# Patient Record
Sex: Female | Born: 1970 | Hispanic: Yes | Marital: Single | Smoking: Never smoker
Health system: Southern US, Community
[De-identification: ages and names within clinical notes are randomized; demographics above are authoritative.]

## PROBLEM LIST (undated history)

## (undated) DIAGNOSIS — F419 Anxiety disorder, unspecified: Secondary | ICD-10-CM

## (undated) HISTORY — PX: OTHER SURGICAL HISTORY: SHX169

## (undated) HISTORY — DX: Anxiety disorder, unspecified: F41.9

---

## 2019-08-30 ENCOUNTER — Ambulatory Visit (INDEPENDENT_AMBULATORY_CARE_PROVIDER_SITE_OTHER): Payer: 59 | Admitting: Emergency Medicine

## 2019-08-30 ENCOUNTER — Encounter: Payer: Self-pay | Admitting: Emergency Medicine

## 2019-08-30 ENCOUNTER — Other Ambulatory Visit: Payer: Self-pay

## 2019-08-30 ENCOUNTER — Ambulatory Visit (INDEPENDENT_AMBULATORY_CARE_PROVIDER_SITE_OTHER): Payer: 59

## 2019-08-30 VITALS — BP 110/74 | HR 75 | Temp 97.7°F | Resp 16 | Ht 65.0 in | Wt 164.0 lb

## 2019-08-30 DIAGNOSIS — R079 Chest pain, unspecified: Secondary | ICD-10-CM

## 2019-08-30 DIAGNOSIS — Z7689 Persons encountering health services in other specified circumstances: Secondary | ICD-10-CM

## 2019-08-30 NOTE — Patient Instructions (Addendum)
If you have lab work done today you will be contacted with your lab results within the next 2 weeks.  If you have not heard from [place] then please contact us. The fastest way to get your results is to register for My Chart.   IF you received an x-ray today, you will receive an invoice from [facility]. Please contact [facility] Radiology at [phone] with questions or concerns regarding your invoice.   IF you received labwork today, you will receive an invoice from [facility]. Please contact [facility] at [phone] with questions or concerns regarding your invoice.   Our billing staff will not be able to assist you with questions regarding bills from these companies.  You will be contacted with the lab results as soon as they are available. The fastest way to get your results is to activate your My Chart account. Instructions are located on the last page of this paperwork. If you have not heard from [place] regarding the results in 2 weeks, please contact this office.     Dolor de pecho inespecfico Nonspecific Chest Pain El dolor de pecho puede deberse a muchas afecciones diferentes. Algunas causas del dolor de pecho pueden ser potencialmente mortales. Estas requieren tratamiento inmediato. Algunas causas grave de dolor en el pecho son las siguientes:  Infarto de miocardio.  Un desgarro en el vaso sanguneo principal del cuerpo.  Enrojecimiento e hinchazn (inflamacin) alrededor del corazn.  Un cogulo de [employer]. Otras causas de dolor en el pecho pueden no ser tan graves. Estos incluyen los siguientes:  Merchant navy officer.  Ansiedad o estrs.  Dao en [place].  Infecciones pulmonares. El dolor de pecho puede provocar las siguientes sensaciones:  Dolor o molestias en el pecho.  Dolor opresivo, continuo o constrictivo.  Ardor u hormigueo.  Dolor sordo o intenso que empeora al [employer], toser o inhalar profundamente.  Dolor  o [employer] tambin se sienten en la espalda, el cuello, la [place], el hombro o el brazo, o dolor que se extiende a cualquiera de estas zonas. Es difcil saber si la causa del dolor es algo grave o algo que no es tan grave. Por lo tanto, es importante que consulte al mdico inmediatamente si tiene Tourist information centre manager. Siga estas indicaciones en su casa: Medicamentos  [employer] de venta libre y los recetados solamente como se lo haya indicado el mdico.  Si le recetaron un antibitico, tmelo como se lo haya indicado el mdico. No deje de tomar los antibiticos aunque comience a Sports administrator. Estilo de vida   Haga reposo como se lo haya indicado el mdico.  No consuma ningn producto que contenga nicotina o tabaco, como cigarrillos, cigarrillos electrnicos y tabaco de Higher education careers adviser. Si necesita ayuda para dejar de fumar, consulte al mdico.  No beba alcohol.  Haga cambios en su estilo de vida segn las indicaciones del mdico. Estos pueden incluir lo siguiente: ? Practicar actividad fsica con regularidad. Pregntele al mdico qu actividades son seguras para usted. ? Seguir una dieta cardiosaludable. Un especialista en dietas y alimentacin (nutricionista) puede ayudarlo a que haga elecciones saludables. ? Mantener un peso saludable. ? Tratar la diabetes o la presin arterial alta, si es necesario. ? Reducir el estrs. Las [employer] yoga y las tcnicas de relajacin pueden ayudar. Indicaciones generales  Est atento a cualquier cambio en los sntomas. Informe a su mdico si presenta algn cambio en los sntomas o si aparecen sntomas  nuevos.  Evite las [employer] causen dolor de [place].  Concurra a todas las visitas de seguimiento como se lo haya indicado el mdico. Esto es importante. Es posible que tenga que someterse a ms estudios si el dolor de pecho no desaparece. Comunquese con un mdico si:  El dolor de pecho no desaparece.  Se siente  deprimido.  Tiene fiebre. Solicite ayuda inmediatamente si:  El dolor en el pecho es ms intenso.  Tiene tos que empeora o tose con [place].  Tiene dolor muy intenso en el vientre (abdomen).  Pierde el conocimiento (se desmaya).  Tiene cualquiera de estos sntomas sin ninguna causa clara: ? Tree surgeon repentino en el pecho. ? Molestias repentinas en los brazos, la espalda, el cuello o la [place].  Le falta el aire en cualquier momento.  Comienza a sudar de [place] repentina o la piel se le humedece.  Siente malestar estomacal (nuseas).  Vomita.  Se siente repentinamente mareado o se desmaya.  Se siente muy dbil o cansado.  El corazn comienza a latirle rpidamente o parece que se saltea latidos. Estos sntomas pueden Sales executive. No espere a ver si los sntomas desaparecen. Solicite atencin mdica de inmediato. Comunquese con el servicio de emergencias de su localidad (911 en los [place]). No conduzca por sus propios medios Principal Financial. Resumen  El dolor de pecho puede deberse a muchas afecciones diferentes. La causa puede ser grave y requerir tratamiento de inmediato. Si tiene dolor de pecho, consulte al mdico de inmediato.  Siga las indicaciones del mdico para tomar los medicamentos y Field seismologist cambios en su estilo de vida.  Concurra a todas las visitas de seguimiento como se lo haya indicado el mdico. Esto incluye las visitas para realizarle otros estudios si el dolor de pecho no desaparece.  Asegrese de [employer] signos que indican que su afeccin ha empeorado. Obtenga ayuda de inmediato si tiene esos sntomas. Esta informacin no tiene Marine scientist el consejo del mdico. Asegrese de hacerle al mdico cualquier pregunta que tenga. Document Revised: 10/06/2017 Document Reviewed: 10/06/2017 Elsevier Patient Education  [place].

## 2019-08-30 NOTE — Progress Notes (Signed)
Meredith Singh 49 y.o.   Chief Complaint  Patient presents with  . Fall    per pt 2 months ago and when she takes a deep breath it causes pain    HISTORY OF PRESENT ILLNESS: This is a 49 y.o. female first visit to this office.  States she fell 2 months ago and landed on her chest, complaining of chest pain since.  Sharp on most of the pain to midsternal and left areas without radiation or associated symptoms.  Worse with movement and deep breathing. Non-smoker.  No history of chronic medical problems. No other complaints or medical concerns today. Fully vaccinated against Covid.  HPI   Prior to Admission medications   Medication Sig Start Date End Date Taking? Authorizing Provider  IBUPROFEN PO Take by mouth as needed.   Yes [provider]    No Known Allergies  There are no problems to display for this patient.   History reviewed. No pertinent past medical history.  History reviewed. No pertinent surgical history.  Social History   Socioeconomic History  . Marital status: Single    Spouse name: Not on file  . Number of children: Not on file  . Years of education: Not on file  . Highest education level: Not on file  Occupational History  . Not on file  Tobacco Use  . Smoking status: Never Smoker  . Smokeless tobacco: Never Used  Substance and Sexual Activity  . Alcohol use: Never  . Drug use: Never  . Sexual activity: Not on file  Other Topics Concern  . Not on file  Social History Narrative  . Not on file   Social Determinants of Health   Financial Resource Strain:   . Difficulty of Paying Living Expenses:   Food Insecurity:   . Worried About Charity fundraiser in the Last Year:   . Arboriculturist in the Last Year:   Transportation Needs:   . Film/video editor (Medical):   Marland Kitchen Lack of Transportation (Non-Medical):   Physical Activity:   . Days of Exercise per Week:   . Minutes of Exercise per Session:   Stress:   . Feeling of Stress :     Social Connections:   . Frequency of Communication with Friends and Family:   . Frequency of Social Gatherings with Friends and Family:   . Attends Religious Services:   . Active Member of Clubs or Organizations:   . Attends Archivist Meetings:   Marland Kitchen Marital Status:   Intimate Partner Violence:   . Fear of Current or Ex-Partner:   . Emotionally Abused:   Marland Kitchen Physically Abused:   . Sexually Abused:     History reviewed. No pertinent family history.   Review of Systems  Constitutional: Negative.  Negative for chills and fever.  HENT: Negative.  Negative for congestion and sore throat.   Respiratory: Negative.  Negative for cough and shortness of breath.   Cardiovascular: Positive for chest pain. Negative for palpitations, orthopnea, claudication and leg swelling.  Gastrointestinal: Negative.  Negative for abdominal pain, blood in stool, nausea and vomiting.  Genitourinary: Negative.  Negative for dysuria and hematuria.  Musculoskeletal: Negative.  Negative for back pain, myalgias and neck pain.  Skin: Negative.  Negative for rash.  Neurological: Negative.  Negative for dizziness and headaches.  All other systems reviewed and are negative.  Today's Vitals   08/30/19 1351  BP: 110/74  Pulse: 75  Resp: 16  Temp: 97.7  F (36.5 C)  TempSrc: Temporal  SpO2: 96%  Weight: 164 lb (74.4 kg)  Height: 5\' 5"  (1.651 m)   Body mass index is 27.29 kg/m.   Physical Exam Vitals reviewed.  Constitutional:      Appearance: Normal appearance.  HENT:     Head: Normocephalic.  Eyes:     Extraocular Movements: Extraocular movements intact.     Conjunctiva/sclera: Conjunctivae normal.     Pupils: Pupils are equal, round, and reactive to light.  Cardiovascular:     Rate and Rhythm: Normal rate and regular rhythm.     Pulses: Normal pulses.     Heart sounds: Normal heart sounds.  Pulmonary:     Effort: Pulmonary effort is normal.     Breath sounds: Normal breath sounds.   Chest:     Chest wall: No tenderness.  Musculoskeletal:        General: Normal range of motion.     Cervical back: Normal range of motion and neck supple.  Skin:    General: Skin is warm and dry.     Capillary Refill: Capillary refill takes less than 2 seconds.  Neurological:     General: No focal deficit present.     Mental Status: She is alert and oriented to person, place, and time.  Psychiatric:        Mood and Affect: Mood normal.        Behavior: Behavior normal.    EKG: Normal sinus rhythm with ventricular rate of 62/min.  No acute ischemic changes.  Normal EKG.   DG Chest 2 View  Result Date: 08/30/2019 CLINICAL DATA:  Chest pain today. EXAM: CHEST - 2 VIEW COMPARISON:  None. FINDINGS: Lungs clear. Heart size normal. No pneumothorax or pleural fluid. No acute or focal bony abnormality. IMPRESSION: Negative chest. Electronically Signed   By: Inge Rise M.D.   On: 08/30/2019 14:26   A total of 45 minutes was spent with the patient, greater than 50% of which was in counseling/coordination of care regarding differential diagnosis of chest pain, review of today's EKG, review of today's chest x-ray, review of past medical history, ED precautions, prognosis and need for follow-up.  ASSESSMENT & PLAN: Emree was seen today for fall.  Diagnoses and all orders for this visit:  Nonspecific chest pain -     EKG 12-Lead -     DG Chest 2 View  Encounter to establish care    Patient Instructions       If you have lab work done today you will be contacted with your lab results within the next 2 weeks.  If you have not heard from [place] then please contact us. The fastest way to get your results is to register for My Chart.   IF you received an x-ray today, you will receive an invoice from [facility]. Please contact [facility] Radiology at [phone] with questions or concerns regarding your invoice.   IF you received labwork today, you will receive an invoice  from [facility]. Please contact [facility] at [phone] with questions or concerns regarding your invoice.   Our billing staff will not be able to assist you with questions regarding bills from these companies.  You will be contacted with the lab results as soon as they are available. The fastest way to get your results is to activate your My Chart account. Instructions are located on the last page of this paperwork. If you have not heard from [place] regarding the results in 2 weeks,  please contact this office.     Dolor de pecho inespecfico Nonspecific Chest Pain El dolor de pecho puede deberse a muchas afecciones diferentes. Algunas causas del dolor de pecho pueden ser potencialmente mortales. Estas requieren tratamiento inmediato. Algunas causas grave de dolor en el pecho son las siguientes:  Infarto de miocardio.  Un desgarro en el vaso sanguneo principal del cuerpo.  Enrojecimiento e hinchazn (inflamacin) alrededor del corazn.  Un cogulo de [employer]. Otras causas de dolor en el pecho pueden no ser tan graves. Estos incluyen los siguientes:  Merchant navy officer.  Ansiedad o estrs.  Dao en [place].  Infecciones pulmonares. El dolor de pecho puede provocar las siguientes sensaciones:  Dolor o molestias en el pecho.  Dolor opresivo, continuo o constrictivo.  Ardor u hormigueo.  Dolor sordo o intenso que empeora al [employer], toser o inhalar profundamente.  Dolor o [employer] tambin se sienten en la espalda, el cuello, la [place], el hombro o el brazo, o dolor que se extiende a cualquiera de estas zonas. Es difcil saber si la causa del dolor es algo grave o algo que no es tan grave. Por lo tanto, es importante que consulte al mdico inmediatamente si tiene Tourist information centre manager. Siga estas indicaciones en su casa: Medicamentos  [employer] de venta libre y los recetados solamente como se lo haya indicado el mdico.  Si le  recetaron un antibitico, tmelo como se lo haya indicado el mdico. No deje de tomar los antibiticos aunque comience a Sports administrator. Estilo de vida   Haga reposo como se lo haya indicado el mdico.  No consuma ningn producto que contenga nicotina o tabaco, como cigarrillos, cigarrillos electrnicos y tabaco de Higher education careers adviser. Si necesita ayuda para dejar de fumar, consulte al mdico.  No beba alcohol.  Haga cambios en su estilo de vida segn las indicaciones del mdico. Estos pueden incluir lo siguiente: ? Practicar actividad fsica con regularidad. Pregntele al mdico qu actividades son seguras para usted. ? Seguir una dieta cardiosaludable. Un especialista en dietas y alimentacin (nutricionista) puede ayudarlo a que haga elecciones saludables. ? Mantener un peso saludable. ? Tratar la diabetes o la presin arterial alta, si es necesario. ? Reducir el estrs. Las [employer] yoga y las tcnicas de relajacin pueden ayudar. Indicaciones generales  Est atento a cualquier cambio en los sntomas. Informe a su mdico si presenta algn cambio en los sntomas o si aparecen sntomas nuevos.  Evite las [employer] causen dolor de [place].  Concurra a todas las visitas de seguimiento como se lo haya indicado el mdico. Esto es importante. Es posible que tenga que someterse a ms estudios si el dolor de pecho no desaparece. Comunquese con un mdico si:  El dolor de pecho no desaparece.  Se siente deprimido.  Tiene fiebre. Solicite ayuda inmediatamente si:  El dolor en el pecho es ms intenso.  Tiene tos que empeora o tose con [place].  Tiene dolor muy intenso en el vientre (abdomen).  Pierde el conocimiento (se desmaya).  Tiene cualquiera de estos sntomas sin ninguna causa clara: ? Tree surgeon repentino en el pecho. ? Molestias repentinas en los brazos, la espalda, el cuello o la [place].  Le falta el aire en cualquier momento.  Comienza a sudar de [place] repentina o  la piel se le humedece.  Siente malestar estomacal (nuseas).  Vomita.  Se siente repentinamente mareado o se desmaya.  Se siente muy dbil o cansado.  El corazn comienza a latirle rpidamente o parece que se saltea latidos. Estos sntomas pueden Sales executive. No espere a ver si los sntomas desaparecen. Solicite atencin mdica de inmediato. Comunquese con el servicio de emergencias de su localidad (911 en los [place]). No conduzca por sus propios medios [employer] Financial. Resumen  El dolor de pecho puede deberse a muchas afecciones diferentes. La causa puede ser grave y requerir tratamiento de inmediato. Si tiene dolor de pecho, consulte al mdico de inmediato.  Siga las indicaciones del mdico para tomar los medicamentos y Field seismologist cambios en su estilo de vida.  Concurra a todas las visitas de seguimiento como se lo haya indicado el mdico. Esto incluye las visitas para realizarle otros estudios si el dolor de pecho no desaparece.  Asegrese de [employer] signos que indican que su afeccin ha empeorado. Obtenga ayuda de inmediato si tiene esos sntomas. Esta informacin no tiene Marine scientist el consejo del mdico. Asegrese de hacerle al mdico cualquier pregunta que tenga. Document Revised: 10/06/2017 Document Reviewed: 10/06/2017 Elsevier Patient Education  2020 Elsevier Inc.      Agustina Caroli, MD [facility]

## 2019-11-08 ENCOUNTER — Encounter: Payer: Self-pay | Admitting: Emergency Medicine

## 2019-11-08 ENCOUNTER — Ambulatory Visit (INDEPENDENT_AMBULATORY_CARE_PROVIDER_SITE_OTHER): Payer: 59 | Admitting: Emergency Medicine

## 2019-11-08 ENCOUNTER — Other Ambulatory Visit: Payer: Self-pay

## 2019-11-08 VITALS — BP 126/70 | HR 82 | Temp 98.6°F | Ht 65.0 in | Wt 165.0 lb

## 2019-11-08 DIAGNOSIS — D259 Leiomyoma of uterus, unspecified: Secondary | ICD-10-CM | POA: Diagnosis not present

## 2019-11-08 DIAGNOSIS — R102 Pelvic and perineal pain: Secondary | ICD-10-CM | POA: Diagnosis not present

## 2019-11-08 DIAGNOSIS — G8929 Other chronic pain: Secondary | ICD-10-CM | POA: Diagnosis not present

## 2019-11-08 MED ORDER — MELOXICAM 7.5 MG PO TABS: 7.5000 mg | ORAL_TABLET | Freq: Every day | ORAL | 0 refills | Status: DC | PRN

## 2019-11-08 NOTE — Patient Instructions (Addendum)
     If you have lab work done today you will be contacted with your lab results within the next 2 weeks.  If you have not heard from [place] then please contact us. The fastest way to get your results is to register for My Chart.   IF you received an x-ray today, you will receive an invoice from [facility] Rad[facility]. Please contact [facility] Radiology at [phone] with questions or concerns regarding your invoice.   IF you received labwork today, you will receive an invoice from [facility]. Please contact [facility] at [phone] with questions or concerns regarding your invoice.   Our billing staff will not be able to assist you with questions regarding bills from these companies.  You will be contacted with the lab results as soon as they are available. The fastest way to get your results is to activate your My Chart account. Instructions are located on the last page of this paperwork. If you have not heard from [place] regarding the results in 2 weeks, please contact this office.      Dolor plvico en la mujer Pelvic Pain, Female El dolor plvico se siente en la parte inferior del vientre (abdomen), debajo del ombligo y a nivel de las caderas. El dolor puede comenzar en forma repentina (ser [employer]), reaparecer (ser recurrente) o durar mucho tiempo (volverse crnico). El dolor plvico que dura ms de 6 meses se denomina dolor plvico crnico. El dolor plvico puede tener muchas causas. A veces, la causa del dolor plvico no se conoce. Siga estas indicaciones en su casa:   Tome los medicamentos de venta libre y los recetados solamente como se lo haya indicado el mdico.  Haga reposo como se lo haya indicado el mdico.  No tenga relaciones sexuales si siente dolor.  Lleve un registro del dolor plvico. Escriba los siguientes datos: ? Cundo Energy manager. ? La ubicacin del dolor. ? Qu parece mejorar o empeorar el dolor, como alimentos o el perodo (ciclo menstrual). ? Cualquier sntoma  que se presente junto con Conservation officer, historic buildings.  Concurra a todas las visitas de control como se lo haya indicado el mdico. Esto es importante. Comunquese con un mdico si:  Los medicamentos no Forensic psychologist.  El dolor regresa.  Aparecen nuevos sntomas.  Tiene sangrado o secrecin inusual de la vagina.  Tiene fiebre o escalofros.  Tiene dificultad para defecar (estreimiento).  Observa sangre en el pis (orina) o en la materia fecal (heces).  El pis tiene mal olor.  Se siente dbil o siente que va a desvanecerse. Solicite ayuda inmediatamente si:  Technical brewer repentino y Wellsite geologist.  El dolor es cada Producer, television/film/video.  Siente un dolor muy intenso y tambin tiene alguno de estos sntomas: ? Cristy Hilts. ? Malestar estomacal (nuseas). ? Vmitos. ? Tiene mucho sudor.  Se desmaya (pierde el conocimiento). Resumen  El dolor plvico se siente en la parte inferior del vientre (abdomen), debajo del ombligo y a nivel de las caderas.  Hay muchas causas posibles del dolor plvico.  Lleve un registro del dolor plvico. Esta informacin no tiene Marine scientist el consejo del mdico. Asegrese de hacerle al mdico cualquier pregunta que tenga. Document Revised: 09/25/2017 Document Reviewed: 09/25/2017 Elsevier Patient Education  [place].

## 2019-11-08 NOTE — Progress Notes (Signed)
Meredith Singh 49 y.o.   Chief Complaint  Patient presents with  . Stress    recently relocation     HISTORY OF PRESENT ILLNESS: This is a 49 y.o. female recently moved to [facility] from [place], needs gynecology referral. Has history of chronic pelvic pain.  Pelvic ultrasound done 3 months ago showed uterine fibroids and bilateral ovarian cysts. Still has regular menstrual periods. Increased amount of emotional stress due to daughter's recent suicide attempt and relocation to this area. Has tried taking ibuprofen 800 mg for pelvic pain but no longer helping. Has no other complaints or medical concerns today.  HPI   Prior to Admission medications   Medication Sig Start Date End Date Taking? Authorizing Provider  IBUPROFEN PO Take by mouth as needed.   Yes [provider]    No Known Allergies  There are no problems to display for this patient.   Past Medical History:  Diagnosis Date  . Anxiety     History reviewed. No pertinent surgical history.  Social History   Socioeconomic History  . Marital status: Single    Spouse name: Not on file  . Number of children: Not on file  . Years of education: Not on file  . Highest education level: Not on file  Occupational History  . Not on file  Tobacco Use  . Smoking status: Never Smoker  . Smokeless tobacco: Never Used  Substance and Sexual Activity  . Alcohol use: Never  . Drug use: Never  . Sexual activity: Not on file  Other Topics Concern  . Not on file  Social History Narrative  . Not on file   Social Determinants of Health   Financial Resource Strain:   . Difficulty of Paying Living Expenses: Not on file  Food Insecurity:   . Worried About Charity fundraiser in the Last Year: Not on file  . Ran Out of Food in the Last Year: Not on file  Transportation Needs:   . Lack of Transportation (Medical): Not on file  . Lack of Transportation (Non-Medical): Not on file  Physical Activity:   . Days of  Exercise per Week: Not on file  . Minutes of Exercise per Session: Not on file  Stress:   . Feeling of Stress : Not on file  Social Connections:   . Frequency of Communication with Friends and Family: Not on file  . Frequency of Social Gatherings with Friends and Family: Not on file  . Attends Religious Services: Not on file  . Active Member of Clubs or Organizations: Not on file  . Attends Archivist Meetings: Not on file  . Marital Status: Not on file  Intimate Partner Violence:   . Fear of Current or Ex-Partner: Not on file  . Emotionally Abused: Not on file  . Physically Abused: Not on file  . Sexually Abused: Not on file    Family History  Problem Relation Age of Onset  . Hyperlipidemia Mother   . Diabetes Mother   . Heart disease Father      Review of Systems  Constitutional: Negative.  Negative for chills and fever.  HENT: Negative.  Negative for congestion and sore throat.   Respiratory: Negative.  Negative for cough and shortness of breath.   Cardiovascular: Negative.  Negative for chest pain and palpitations.  Gastrointestinal: Negative.  Negative for abdominal pain, diarrhea, nausea and vomiting.  Genitourinary: Negative.  Negative for dysuria and hematuria.  Musculoskeletal: Negative.  Negative for back  pain, myalgias and neck pain.  Skin: Negative.  Negative for rash.  Neurological: Negative for dizziness and headaches.  All other systems reviewed and are negative.  Today's Vitals   11/08/19 1436  BP: 126/70  Pulse: 82  Temp: 98.6 F (37 C)  TempSrc: Temporal  SpO2: 97%  Weight: 165 lb (74.8 kg)  Height: 5\' 5"  (1.651 m)   Body mass index is 27.46 kg/m.   Physical Exam Vitals reviewed.  Constitutional:      Appearance: Normal appearance.  HENT:     Head: Normocephalic.  Eyes:     Extraocular Movements: Extraocular movements intact.     Conjunctiva/sclera: Conjunctivae normal.     Pupils: Pupils are equal, round, and reactive to  light.  Cardiovascular:     Rate and Rhythm: Normal rate and regular rhythm.     Pulses: Normal pulses.     Heart sounds: Normal heart sounds.  Pulmonary:     Effort: Pulmonary effort is normal.     Breath sounds: Normal breath sounds.  Abdominal:     General: There is no distension.     Palpations: Abdomen is soft.     Tenderness: There is no abdominal tenderness.  Musculoskeletal:     Cervical back: Normal range of motion and neck supple.  Skin:    General: Skin is warm and dry.     Capillary Refill: Capillary refill takes less than 2 seconds.  Neurological:     General: No focal deficit present.     Mental Status: She is alert and oriented to person, place, and time.  Psychiatric:        Mood and Affect: Mood normal.        Behavior: Behavior normal.     A total of 30 minutes was spent with the patient, greater than 50% of which was in counseling/coordination of care regarding differential diagnosis of chronic pelvic pain, need for diagnostic work-up including gynecology evaluation, blood work, pain management, diet and nutrition, prognosis and need for follow-up.   ASSESSMENT & PLAN: Meredith Singh was seen today for stress.  Diagnoses and all orders for this visit:  Chronic pelvic pain in female -     Ambulatory referral to Gynecology -     CBC with Differential/Platelet -     Comprehensive metabolic panel  Uterine leiomyoma, unspecified location -     Ambulatory referral to Gynecology    Patient Instructions       If you have lab work done today you will be contacted with your lab results within the next 2 weeks.  If you have not heard from [place] then please contact us. The fastest way to get your results is to register for My Chart.   IF you received an x-ray today, you will receive an invoice from [facility]. Please contact [facility] at [phone] with questions or concerns regarding your invoice.   IF you received labwork today, you will  receive an invoice from [facility]. Please contact [facility] at [phone] with questions or concerns regarding your invoice.   Our billing staff will not be able to assist you with questions regarding bills from these companies.  You will be contacted with the lab results as soon as they are available. The fastest way to get your results is to activate your My Chart account. Instructions are located on the last page of this paperwork. If you have not heard from [place] regarding the results in 2 weeks, please contact this office.  Dolor plvico en la mujer Pelvic Pain, Female El dolor plvico se siente en la parte inferior del vientre (abdomen), debajo del ombligo y a nivel de las caderas. El dolor puede comenzar en forma repentina (ser [employer]), reaparecer (ser recurrente) o durar mucho tiempo (volverse crnico). El dolor plvico que dura ms de 6 meses se denomina dolor plvico crnico. El dolor plvico puede tener muchas causas. A veces, la causa del dolor plvico no se conoce. Siga estas indicaciones en su casa:   Tome los medicamentos de venta libre y los recetados solamente como se lo haya indicado el mdico.  Haga reposo como se lo haya indicado el mdico.  No tenga relaciones sexuales si siente dolor.  Lleve un registro del dolor plvico. Escriba los siguientes datos: ? Cundo Energy manager. ? La ubicacin del dolor. ? Qu parece mejorar o empeorar el dolor, como alimentos o el perodo (ciclo menstrual). ? Cualquier sntoma que se presente junto con Conservation officer, historic buildings.  Concurra a todas las visitas de control como se lo haya indicado el mdico. Esto es importante. Comunquese con un mdico si:  Los medicamentos no Forensic psychologist.  El dolor regresa.  Aparecen nuevos sntomas.  Tiene sangrado o secrecin inusual de la vagina.  Tiene fiebre o escalofros.  Tiene dificultad para defecar (estreimiento).  Observa sangre en el pis (orina) o en la materia fecal (heces).  El  pis tiene mal olor.  Se siente dbil o siente que va a desvanecerse. Solicite ayuda inmediatamente si:  Technical brewer repentino y Wellsite geologist.  El dolor es cada Producer, television/film/video.  Siente un dolor muy intenso y tambin tiene alguno de estos sntomas: ? Cristy Hilts. ? Malestar estomacal (nuseas). ? Vmitos. ? Tiene mucho sudor.  Se desmaya (pierde el conocimiento). Resumen  El dolor plvico se siente en la parte inferior del vientre (abdomen), debajo del ombligo y a nivel de las caderas.  Hay muchas causas posibles del dolor plvico.  Lleve un registro del dolor plvico. Esta informacin no tiene Marine scientist el consejo del mdico. Asegrese de hacerle al mdico cualquier pregunta que tenga. Document Revised: 09/25/2017 Document Reviewed: 09/25/2017 Elsevier Patient Education  2020 Elsevier Inc.      Agustina Caroli, MD [facility]

## 2019-11-09 LAB — CBC WITH DIFFERENTIAL/PLATELET
Basophils Absolute: 0 10*3/uL (ref 0.0–0.2)
Basos: 0 %
EOS (ABSOLUTE): 0.1 10*3/uL (ref 0.0–0.4)
Eos: 1 %
Hematocrit: 37.4 % (ref 34.0–46.6)
Hemoglobin: 12.1 g/dL (ref 11.1–15.9)
Immature Grans (Abs): 0 10*3/uL (ref 0.0–0.1)
Immature Granulocytes: 0 %
Lymphocytes Absolute: 2.1 10*3/uL (ref 0.7–3.1)
Lymphs: 26 %
MCH: 28.7 pg (ref 26.6–33.0)
MCHC: 32.4 g/dL (ref 31.5–35.7)
MCV: 89 fL (ref 79–97)
Monocytes Absolute: 0.6 10*3/uL (ref 0.1–0.9)
Monocytes: 7 %
Neutrophils Absolute: 5.3 10*3/uL (ref 1.4–7.0)
Neutrophils: 66 %
Platelets: 297 10*3/uL (ref 150–450)
RBC: 4.22 x10E6/uL (ref 3.77–5.28)
RDW: 13.3 % (ref 11.7–15.4)
WBC: 8.1 10*3/uL (ref 3.4–10.8)

## 2019-11-09 LAB — COMPREHENSIVE METABOLIC PANEL
ALT: 21 IU/L (ref 0–32)
AST: 15 IU/L (ref 0–40)
Albumin/Globulin Ratio: 1.4 (ref 1.2–2.2)
Albumin: 4.2 g/dL (ref 3.8–4.8)
Alkaline Phosphatase: 85 IU/L (ref 44–121)
BUN/Creatinine Ratio: 31 — ABNORMAL HIGH (ref 9–23)
BUN: 16 mg/dL (ref 6–24)
Bilirubin Total: <0.2 mg/dL (ref 0.0–1.2)
CO2: 22 mmol/L (ref 20–29)
Calcium: 9.2 mg/dL (ref 8.7–10.2)
Chloride: 105 mmol/L (ref 96–106)
Creatinine, Ser: 0.52 mg/dL — ABNORMAL LOW (ref 0.57–1.00)
GFR calc Af Amer: 131 mL/min/{1.73_m2} (ref >59)
GFR calc non Af Amer: 113 mL/min/{1.73_m2} (ref >59)
Globulin, Total: 3.1 g/dL (ref 1.5–4.5)
Glucose: 126 mg/dL — ABNORMAL HIGH (ref 65–99)
Potassium: 4.1 mmol/L (ref 3.5–5.2)
Sodium: 140 mmol/L (ref 134–144)
Total Protein: 7.3 g/dL (ref 6.0–8.5)

## 2019-11-26 ENCOUNTER — Ambulatory Visit: Payer: 59 | Admitting: Obstetrics and Gynecology

## 2019-11-26 ENCOUNTER — Encounter: Payer: Self-pay | Admitting: Obstetrics and Gynecology

## 2019-11-26 ENCOUNTER — Other Ambulatory Visit: Payer: Self-pay

## 2019-11-26 VITALS — BP 118/74 | Ht 65.0 in | Wt 163.0 lb

## 2019-11-26 DIAGNOSIS — R102 Pelvic and perineal pain: Secondary | ICD-10-CM

## 2019-11-26 DIAGNOSIS — B9689 Other specified bacterial agents as the cause of diseases classified elsewhere: Secondary | ICD-10-CM

## 2019-11-26 DIAGNOSIS — N852 Hypertrophy of uterus: Secondary | ICD-10-CM | POA: Diagnosis not present

## 2019-11-26 DIAGNOSIS — N898 Other specified noninflammatory disorders of vagina: Secondary | ICD-10-CM

## 2019-11-26 DIAGNOSIS — N760 Acute vaginitis: Secondary | ICD-10-CM | POA: Diagnosis not present

## 2019-11-26 LAB — WET PREP FOR TRICH, YEAST, CLUE

## 2019-11-26 MED ORDER — METRONIDAZOLE 500 MG PO TABS: 500.0000 mg | ORAL_TABLET | Freq: Two times a day (BID) | ORAL | 0 refills | Status: DC

## 2019-11-26 NOTE — Patient Instructions (Addendum)
Qu son los fibromas?--Los fibromas son ndulos anormales que se forman en el msculo del tero (figura 1). El tero, tambin llamado matriz, es la parte del cuerpo donde se aloja un beb en caso de [place]. A veces, se [place] la palabra "tumores" para referirse a los fibromas. Sin embargo, los fibromas no son un tipo de Hotel manager.  Cules son los sntomas de los fibromas?--En general, los fibromas no provocan ningn sntoma, pero cuando s los provocan, estos pueden ser algunos: ?Periodos intensos ?Dolor, presin o sensacin de "llenura" en el rea del estmago ?Necesidad de Garment/textile technologist con frecuencia ?Evacuacin con poca frecuencia (estreimiento) ?Dificultad para quedar embarazada Cmo se tratan los fibromas?--Existen varias opciones de [place]. Cada opcin tiene sus riesgos y sus beneficios. El tratamiento adecuado para usted depender de: ?Sus sntomas ?Su edad (la [employer] de los fibromas se encogen y dejan de causar sntomas despus de la menopausia, que es cuando deja de tener sus periodos [place]) ?Si quiere quedar embarazada ms adelante o no ?Si los fibromas la Cabin crew tanto que padece de anemia ?El [place], la cantidad y la ubicacin de los fibromas ?Cmo se siente con respecto a los riesgos y beneficios de cada opcin Si est considerando el tratamiento, pregunte a su mdico o enfermero qu tratamiento podra ayudarla. Luego, infrmese acerca de los riesgos y beneficios de cada opcin. Tambin pregunte qu sucede si no se somete a ningn tratamiento, y asegrese de Engineer, manufacturing si quiere o no quedar embarazada ms adelante. Estas son las opciones: ?Evans pldoras, los parches, los anillos vaginales, las inyecciones y los implantes utilizados como mtodo anticonceptivo pueden disminuir el sangrado durante el periodo. Algunos tipos de dispositivos intrauterinos, o DIU, tambin pueden disminuir la intensidad del periodo. Adems de los mtodos de planificacin familiar, hay  medicinas que pueden hacer que los sangrados sean menos intensos. Si el sangrado es su sntoma principal, el mdico podra recetarle alguna de esas medicinas. ?[place] para extirpar los fibromas - Se llama "miomectoma". Durante esta operacin, el mdico extirpa los fibromas pero deja el tero en su lugar. Es Armed forces logistics/support/administrative officer, pero no siempre es una solucin permanente, ya que los fibromas pueden volver a producirse. En general, la miomectoma es una buena opcin para quienes podran querer tener un embarazo ms adelante. ?Tratamiento para interrumpir el suministro de sangre a los fibromas - Se llama "embolizacin de arterias uterinas" o "embolizacin de fibroma uterino". En este procedimiento, el mdico inserta un tubo delgado en una arteria de una pierna y lo desplaza hasta el tero. Luego, utiliza pequeas cuentas de plstico para bloquear la arteria que lleva sangre al fibroma. Despus del procedimiento, el fibroma deja de recibir [place] y se encoge. Este procedimiento no es adecuado para las personas que tal vez quieran quedar embarazadas. ?Tratamiento para destruir la capa interior del tero - Se llama "ablacin endometrial". Durante este procedimiento, el mdico introduce un tubo delgado en la vagina y el cuello uterino hasta llegar al tero. Luego, utiliza instrumentos que coloca a travs de ese tubo para destruir la capa interior del tero. Este procedimiento [employer] sangrado de los periodos intensos, pero no es una opcin para todas las Engineer, manufacturing. Adems, tampoco es adecuado para quienes podran querer Nutritional therapist. ?Ciruga para sacar el tero - Se llama "histerectoma". Por medio de [place] se eliminan para siempre los fibromas y los problemas que estos causan. Si se somete a una histerectoma, los fibromas no volvern a Arts administrator, pero usted no podr Chiropodist futuro. Cmo escojo  la mejor opcin para m?--Su mdico colaborar con usted para ayudarle a comprender las diferentes opciones  de tratamiento y cul sera el efecto de [place]. Juntos decidirn cul es la opcin adecuada para usted. Deber tener en cuenta cun invasiva es cada ciruga y si prefiere una ciruga en lugar de usar medicinas. Tambin es conveniente que piense sobre lo siguiente: ?Si desea tener un embarazo ms adelante - Si es posible que quiera quedar [place], las medicinas o la miomectoma suelen ser las mejores opciones. Si no desea tener hijos, o si ya tiene hijos y no desea tener ms, en general puede elegir cualquiera de las opciones. ?En cunto tiempo es probable que [employer] menopausia - Los sntomas relacionados con los fibromas suelen desaparecer con la menopausia, por lo que su edad podra influir en el tratamiento que elija.      Educacin para el paciente: Anticonceptivos hormonales (Conceptos Bsicos) View in    [place] por los mdicos y editores de UpToDate There is a newer version of this topic available in [place]. Hay una versin de este artculo ms actualizada disponible en [place]. Qu es un anticonceptivo hormonal?--Un anticonceptivo hormonal es cualquier pldora, inyeccin, dispositivo o tratamiento que [place] hormonas para Therapist, occupational. Existen distintos tipos de anticonceptivos hormonales. Algunos contienen las hormonas estrgeno y Agricultural engineer. Otros contienen solo progestina. Si bien ningn mtodo de planificacin familiar es 100 por ciento eficaz todo el [place], los mtodos hormonales son muy eficaces para Patent attorney. Se diferencian en la facilidad de uso y sus efectos secundarios (tabla 1): ?Pldoras - Si decide tomar pldoras anticonceptivas, deber tomar [employer]. Saltearse pldoras puede aumentar el riesgo de quedar en [place]. Los paquetes de pldoras anticonceptivas generalmente incluyen pldoras sin hormonas para 4 a 7 das del mes. Durante esos das sin hormonas tendr su periodo. Si prefiere no tener el periodo, puede saltearse las  pldoras sin hormonas y tomar todos los das una pldora con hormonas. Esto se llama "dosificacin continua". La mayora de las pldoras anticonceptivas contienen estrgeno y progestina, pero algunas solo contienen progestina. ?Parches para piel - [address] parches disponibles (marcas comerciales: Margaretmary Dys). Puede colocarse el parche en el hombro, la espalda, el rea del estmago o la cadera (figura 1). Uno de ellos Marilu Favre) tambin se Retail buyer en la parte superior de un brazo. El parche se debe cambiar una vez por semana, y cada vez se coloca en un sitio distinto. Por lo general se [place] un parche nuevo por semana durante 3 semanas. Durante la cuarta semana no se lo pone, y en esa semana tiene el periodo. Los parches para planificacin familiar que se usan en la piel contienen estrgeno y progestina. ?Anillos vaginales - Es un anillo flexible que se coloca en la vagina (marcas comerciales: Annovera, EluRyng, NuvaRing). Puede permanecer colocado hasta 3 semanas (figura 2). El anillo libera hormonas en la vagina. No se lo debe retirar mientras tiene [employer]. Antes y despus de Office manager sexuales debe verificar que el anillo siga en su lugar. Si el anillo se retira o se sale, puede permanecer afuera hasta 3 horas. Por lo general, el anillo se [place] por 3 semanas. Luego, segn el anillo que tenga, lo debe desechar, o limpiarlo y guardarlo para volver a colocrselo ms adelante. Estar sin anillo durante la semana 4, que es cuando tendr su periodo. Si lo desea, puede continuar usando un anillo por ms de 3 semanas. Si hace esto, no tendr un periodo regular,  aunque podra tener un sangrado ligero o "prdida". Los anillos vaginales para planificacin familiar contienen estrgeno y progestina. ?Inyecciones - Si [place] inyecciones de hormonas, recibir una inyeccin en el brazo o en las nalgas cada 3 meses. Las inyecciones para planificacin familiar (marca comercial: Depo-Provera) contienen  solamente progestina. ?Implantes - Un implante para planificacin familiar es una varilla pequea que libera hormonas en el brazo (figura 3). La debe implantar un mdico o enfermero y se puede dejar en el brazo durante un mximo de 3 aos. Los implantes para planificacin familiar (marca comercial: Nexplanon) contienen solamente progestina. ?Dispositivo intrauterino (DIU) con liberacin de hormonas - DIU significa "dispositivo intrauterino". Es un dispositivo que se coloca dentro del tero para Patent attorney (figura 4). Algunos DIU funcionan liberando hormonas en el cuerpo (marcas comerciales: Burnard Bunting, Isla Pence). Segn el dispositivo intrauterino (DIU) con liberacin de hormonas que use y su edad, este puede Public affairs consultant colocado durante 3 a 6 aos. El DIU con liberacin de hormonas contiene la hormona levonorgestrel, que es una progestina.  Los anticonceptivos hormonales son Ardelia Mems forma segura y confiable de evitar un Media planner para la mayora de las personas, [place] no protegen de las infecciones que se contagian a travs de las relaciones sexuales (llamadas "infecciones de transmisin sexual" o "enfermedades de transmisin sexual"). Cmo elijo el anticonceptivo hormonal correcto para m?--Trabaje con su mdico o enfermero para elegir la mejor opcin para usted. Cuando considere su decisin, piense en qu posibilidades existen de que use cada mtodo correctamente. Puede acordarse de tomar [employer]? Puede acordarse de cambiar el parche una vez por semana? Los mtodos de accin prolongada (DIU, implante) son los ms convenientes porque funcionan durante 3 a 10 aos, segn el mtodo. La inyeccin, que funciona durante 3 meses, podra ser ms [employer] pldora, el parche o el [place]. Adems, pregunte a su mdico qu efectos tendr [employer] periodo el mtodo que est considerando. En la tabla se enumeran los efectos secundarios y los riesgos de cada mtodo (tabla  1). Los anticonceptivos hormonales son seguros para todas las personas?--No. Psychologist, clinical no deben usar anticonceptivos hormonales que contienen estrgeno; Tree surgeon, las personas que: ?Tienen 35 aos de edad o ms y fuman cigarrillos - Estas cosas aumentan el riesgo de sufrir infartos y accidentes cerebrovasculares (derrames). ?Podran estar embarazadas - Antes de recetarle un mtodo de planificacin familiar hormonal, su mdico o enfermero le har preguntas para asegurarse de que no est en embarazo. Tal vez tenga que hacerse una prueba de embarazo para [place]. ?Han tenido cogulos de [place] o un accidente cerebrovascular (derrame) en el pasado ?Estn recibiendo tratamiento para Science writer de seno o han tenido cncer de seno ?Tienen periodos irregulares o muy intensos - Si tiene [employer], debe ser examinada antes de que comience a Risk manager un mtodo anticonceptivo hormonal. ?Tienen algunos tipos de enfermedad del hgado - Los mtodos anticonceptivos hormonales pueden empeorar algunos tipos de enfermedad del hgado. ?Tienen algunos tipos de enfermedad del corazn ?Tienen el tipo de migraa que causa problemas de visin o audicin Puede usar un mtodo de planificacin familiar hormonal incluso si tiene presin arterial alta. Sin embargo, su presin arterial deber estar bien controlada y un mdico deber revisrsela peridicamente. Muchas personas que no pueden tomar anticonceptivos hormonales que contienen estrgeno pueden tomar otros tipos de anticonceptivos hormonales que contienen solo progestina. De lo contrario, pueden usar mtodos que no contengan hormonas. Qu sucede si tomo otras medicinas adems de los anticonceptivos?--Algunas medicinas pueden afectar  el funcionamiento de los anticonceptivos hormonales, como por ejemplo: ?Software engineer que se usan para prevenir las crisis neurolgicas (llamadas "anticonvulsivas") ?Ciertos antibiticos que se usan para tratar la  tuberculosis (rifampina y rifabutina)  ?[place] (una hierba medicinal para la depresin) Si toma alguna de estas medicinas, hable con su mdico sobre cmo Physiological scientist. Adems, si ya toma anticonceptivos hormonales, infrmeselo a cualquier mdico o enfermero que le recete medicinas. Qu sucede si olvido usar mi anticonceptivo hormonal?--Si tiene Public affairs consultant sexuales y [employer] usar su mtodo de Marine scientist, puede tomar contracepcin de emergencia para reducir el riesgo de un embarazo. Algunos tipos de contracepcin de emergencia necesitan receta, pero otros pueden comprarse en la farmacia. El DIU es el mtodo ms efectivo de Production assistant, radio de Freight forwarder, pero debe colocarlo un mdico o enfermero. Si necesita usar contracepcin de Freight forwarder, hgalo lo antes posible despus de la relacin sexual.

## 2019-11-26 NOTE — Progress Notes (Signed)
Meredith Singh [birth year] [id number]  SUBJECTIVE:  49 y.o. [id number] female presents for evaluation of pelvic pain. When she gets a period and half way through the period, the pain begins and lasts for several days. She also has pelvic pain intermittently the rest of the month.  Pain is in her lower abdomen and pelvis, mostly midline, no radiation to left or right or to back.  Sometimes the pain will radiate to her legs. She did note that the pelvic pain got worse about 1 year ago.  She had a Paragard IUD placed 7 years ago.  She has a regular period every month.  She has menstrual flow that starts very light then gets progressively heavier in the first few days until it tapers off over the course of 8-9 days a little.  She does not associate the pain with eating, bowel movements, or physical activity.  She has had some spotting after sexual activity but no dyspareunia.  Sexually active with her husband only.  No pelvic infection or discharge concerns.  Reports normal Pap smear history, most recently in 2020 she reports.  She said she had a pelvic ultrasound elsewhere that showed some fibroids.  Report in care everywhere from [facility] indicates she had a large uterus measuring 12.9 by 5.2 x 7.0 cm and a 1.5 cm fibroid, ovaries were normal and there were no ovarian cysts, just some nabothian cysts.  IUD was seen within the uterine cavity. She had been on birth control pills in the past but this caused her spots and rash on face similar to facial rash of pregnancy.  She had tried about 3 different types of birth control but noticed this as a side effect.   Reports daily normal bowel movements no straining or constipation.  Prior vaginal deliveries.  Professional Spanish interpreter is present at the visit today.    No current outpatient medications on file.   No current facility-administered medications for this visit.   Allergies: Patient has no known allergies.  Patient's last menstrual period was  11/05/2019.  Past medical history,surgical history, problem list, medications, allergies, family history and social history were all reviewed and documented as reviewed in the EPIC chart.  ROS: Pertinent positives and negatives as reviewed above in HPI    OBJECTIVE:  BP 118/74   Ht 5\' 5"  (1.651 m)   Wt 163 lb (73.9 kg)   LMP 11/05/2019   Breastfeeding Unknown   BMI 27.12 kg/m  The patient appears well, alert, oriented, in no distress. Abdomen: Soft, nondistended, tenderness to deeper palpation in the midline, no right or left lower quadrant tenderness.  No rebound or guarding. PELVIC EXAM: VULVA: normal appearing vulva with no masses, tenderness or lesions, VAGINA: normal appearing vagina with normal color, thick grayish-white discharge does not, no odor, no lesions, CERVIX: normal appearing cervix without discharge or lesions, IUD strings visualized, UTERUS: uterus is mobile but bulky and enlarged especially to the right aspect with what seems to be a palpable fibroid, the uterus is diffusely mildly tender, palpates to about 12 weeks in size, ADNEXA: normal adnexa in size, nontender and no masses Vaginal wet mount + clue cells, moderate WBC, numerous bacteria, normal epithelial cells Urinalysis 6-10 WBC, 3-10 RBC, 6-10 squamous epithelial cells, moderate bacteria, 1+ leukocyte esterase, negative nitrate, cloudy  Chaperone: Caryn Bee present during the examination  ASSESSMENT:  49 y.o. [id number] here for evaluation of pelvic pain with an enlarged uterus, bacterial vaginosis, contraception discussion  PLAN:  We discussed the  finding of an enlarged uterus based on the examination today, I feel like she has larger fibroids and what was indicated in the ultrasound report from 04/2019 from an outside facility.  I think would be a good idea to repeat the pelvic ultrasound.  She has tenderness over the uterus so this makes me think her fibroids probably also do with her discomfort.  We discussed  fibroids are typically benign tumors, very common in women, can contribute to dysmenorrhea and pelvic pain, her periods are also a little on the long side, and she does have a ParaGard IUD, we discussed that sometimes this can make periods last longer and also cause irregular bleeding.  To address her fibroid pain I recommended considering hormonal contraceptive options and removing the ParaGard IUD.  UA today is not strongly suggestive of UTI but will send the urine for culture.    We discussed contraceptive options including the OCPs, patch, NuvaRing, LARC options including hormonal IUD and Nexplanon.  She was hoping for something where she would continue to get her periods.  I explained to her that I think it would probably be better for her to be on a contraceptive that would lessen the frequency of periods that she has as that seems to be a trigger for her pelvic pain.  I gave her information printouts in Spanish on uterine fibroids and contraceptive options from Up To Date which were printed and provided to the patient at checkout.  If conservative options are not improving the symptoms enough she might need to consider surgical options including fibroid ablation, uterine artery embolization, myomectomy, or hysterectomy.  We could further discuss those options at a later time.  She also was found to have bacterial vaginosis today.  This may also contribute to pelvic pain but I do not think that it is the only cause as I think she has fairly prominent fibroids based on the examination today.  Will treat with metronidazole 500 mg twice daily for 7 days. Discussed avoidance of alcohol and possible side effects.  She will schedule a pelvic ultrasound we will further discuss how she like to proceed at that point.   Joseph Pierini MD 11/26/19

## 2019-11-28 LAB — URINE CULTURE
MICRO NUMBER:: 11049644
Result:: NO GROWTH
SPECIMEN QUALITY:: ADEQUATE

## 2019-11-28 LAB — URINALYSIS, COMPLETE W/RFL CULTURE
Bilirubin Urine: NEGATIVE
Glucose, UA: NEGATIVE
Hyaline Cast: NONE SEEN /LPF
Ketones, ur: NEGATIVE
Nitrites, Initial: NEGATIVE
Protein, ur: NEGATIVE
Specific Gravity, Urine: 1.025 (ref 1.001–1.03)
pH: 6.5 (ref 5.0–8.0)

## 2019-11-28 LAB — CULTURE INDICATED

## 2019-12-29 ENCOUNTER — Telehealth: Payer: Self-pay | Admitting: *Deleted

## 2019-12-29 NOTE — Telephone Encounter (Signed)
Patient daughter Verdis Frederickson informed. Transferred to appointment desk to schedule.

## 2019-12-29 NOTE — Telephone Encounter (Signed)
Did her original symptoms improve after the antibiotic?  If so and now she has other symptoms, I would recommend that she just start with the Monistat 7 night vaginal cream If her symptoms never did improve after the antibiotic then needs a return for evaluation

## 2019-12-29 NOTE — Telephone Encounter (Signed)
Patient daughter Meredith Singh called stating her mother( spanish speaking) completed the Flagyl 500 mg tablet prescribed at [facility] on 11/26/19. Called today c/o mild vaginal odor, along with mild discomfort and vaginal irritation and external itching. Patient daughter said mother is requesting Rx to help relieve discomfort.

## 2020-01-03 ENCOUNTER — Ambulatory Visit: Payer: 59 | Admitting: Obstetrics and Gynecology

## 2020-01-03 DIAGNOSIS — Z0289 Encounter for other administrative examinations: Secondary | ICD-10-CM

## 2020-01-06 ENCOUNTER — Ambulatory Visit: Payer: 59 | Admitting: Obstetrics and Gynecology

## 2020-01-06 ENCOUNTER — Other Ambulatory Visit: Payer: 59

## 2020-01-20 ENCOUNTER — Other Ambulatory Visit: Payer: Self-pay

## 2020-01-20 ENCOUNTER — Encounter: Payer: Self-pay | Admitting: Obstetrics and Gynecology

## 2020-01-20 ENCOUNTER — Ambulatory Visit (INDEPENDENT_AMBULATORY_CARE_PROVIDER_SITE_OTHER): Payer: 59

## 2020-01-20 ENCOUNTER — Ambulatory Visit (INDEPENDENT_AMBULATORY_CARE_PROVIDER_SITE_OTHER): Payer: 59 | Admitting: Obstetrics and Gynecology

## 2020-01-20 VITALS — BP 130/80

## 2020-01-20 DIAGNOSIS — D219 Benign neoplasm of connective and other soft tissue, unspecified: Secondary | ICD-10-CM

## 2020-01-20 DIAGNOSIS — N924 Excessive bleeding in the premenopausal period: Secondary | ICD-10-CM

## 2020-01-20 DIAGNOSIS — N852 Hypertrophy of uterus: Secondary | ICD-10-CM | POA: Diagnosis not present

## 2020-01-20 DIAGNOSIS — Z975 Presence of (intrauterine) contraceptive device: Secondary | ICD-10-CM

## 2020-01-20 DIAGNOSIS — D251 Intramural leiomyoma of uterus: Secondary | ICD-10-CM

## 2020-01-20 DIAGNOSIS — N946 Dysmenorrhea, unspecified: Secondary | ICD-10-CM | POA: Diagnosis not present

## 2020-01-20 DIAGNOSIS — N854 Malposition of uterus: Secondary | ICD-10-CM

## 2020-01-20 DIAGNOSIS — R102 Pelvic and perineal pain: Secondary | ICD-10-CM | POA: Diagnosis not present

## 2020-01-20 DIAGNOSIS — Z3009 Encounter for other general counseling and advice on contraception: Secondary | ICD-10-CM

## 2020-01-20 NOTE — Progress Notes (Signed)
Meredith Singh 1970-09-28 [id number]  SUBJECTIVE:  49 y.o. [id number] female presents for pelvic ultrasound to further evaluate reported fibroid uterus and pelvic pain.  She has a ParaGard IUD in place for the last 7 years.  She was seen for her initial consultation 11/26/2019.  She was found at that time to have bacterial vaginosis and was treated. Not currently having pelvic pain but still has painful periods. She also wants to maintain contraception but she would like to try to see if removing the IUD helps with her dysmenorrhea. She says her periods are also heavier than she would like. She is hesitant to try other hormonal birth control options because of in the past she has experienced a facial discoloration with a melasma-like condition. She brings up that she would like to discuss permanent sterilization options today. Spanish interpreter present for the encounter today.  No current outpatient medications on file.   No current facility-administered medications for this visit.   Allergies: Patient has no known allergies.  Patient's last menstrual period was 12/31/2019 (lmp unknown).  Past medical history,surgical history, problem list, medications, allergies, family history and social history were all reviewed and documented as reviewed in the EPIC chart.  ROS: Pertinent positives and negatives as reviewed in HPI    OBJECTIVE:  BP 130/80 (BP Location: Right Arm, Patient Position: Sitting, Cuff Size: Normal)   LMP 12/31/2019 (LMP Unknown)   Breastfeeding No  The patient appears well, alert, oriented x 3, in no distress.  PELVIC EXAM: Deferred as this was performed at last visit and patient had imaging today  Pelvic ultrasound Anteverted uterus 9.7 x 6.0 x 5.4 cm.  Normal size and shape.  Multiple intramural fibroids, small, largest measuring 1.3 x 1.2 cm. IUD visualized in correct position within the endometrium. Bilateral ovaries normal with normal follicle pattern. No adnexal masses.   No free fluid.  ASSESSMENT:  49 y.o. [id number] here for discussion of permanent sterilization, small fibroids, dysmenorrhea, mild menorrhagia  PLAN:  At her request, we discussed permanent sterilization.  We discussed operative laparoscopy with bilateral salpingectomy and IUD removal.   I discussed the theoretical benefit of performing salpingectomy over tubal fulguration to include potential for decreased risk of fallopian tube and/or ovarian cancer in the future.  The surgery is performed under general anesthesia which has risks including stroke, heart attack or even death.  The procedure itself has risks of infection, bleeding, possible need for blood transfusion, injury to surrounding organ structures including the uterus, ovaries, bowel, bladder, ureter, major pelvic blood vessels, and irritation or dysfunction of nerves in the skin or extremities from positioning during the procedure. Rarely would there be a need to convert to laparotomy for concerns of injury or intra-abdominal hemorrhage.   Postoperative recovery expectations were discussed including the anticipation of this being same-day procedure barring any complication, the need to avoid driving for at least the first few days after the procedure if she is taking narcotic medication and/or having discomfort.  Expected discomforts after laparoscopy, and avoiding lifting of weight greater than 10-20 lbs first few weeks after the procedure and other physical restrictions during recovery are reviewed.  The patient understands that partial salpingectomy carries an approximately 1 in 200 risk of failure, meaning that some patients may still be able to get pregnant after the procedure in rare cases.  There is not currently good data regarding the risk of contraceptive failure in the case of bilateral salpingectomy.  We discussed that the salpingectomy would not address  the dysmenorrhea nor the heaviness of periods, and in some cases, those problems can  become a little bit more pronounced after salpingectomy/tubal ligation, and some women do need to trial hormonal contraception in those cases to alleviate the symptoms despite having had a sterilization procedure. Endometrial ablation could be performed but ultimately we decided to hold off on that for now and just see how she does without the ParaGard IUD in place.  Her uterine fibroids are very small and I do not think they are in any way contributing significantly to her symptoms.  Patient is reassured by this.  She acknowledges understanding of this and would like to move forward with the surgery. I will have my scheduler contact the patient. I will put information in her chart from up-to-date regarding permanent sterilization and uterine fibroids.   Joseph Pierini MD 01/20/20

## 2020-01-20 NOTE — Patient Instructions (Signed)
Fibromas uterinos Uterine Fibroids  Los fibromas uterinos (liomiomas) son tumores no cancerosos (benignos) que pueden Therapist, art. Los fibromas tambin pueden crecer en las trompas de [place], el cuello uterino o los tejidos (ligamentos) cerca del tero. Puede tener uno o muchos fibromas. Los fibromas tienen diferentes tamaos y pesos, y crecen en distintas partes del tero. Algunos pueden crecer hasta volverse bastante grandes. La mayora de los fibromas no requiere tratamiento mdico. Cules son las causas? Se desconoce la causa de esta afeccin. Qu incrementa el riesgo? Es ms probable que tenga esta afeccin si:  Tiene entre 30 y 49 aos de edad y no ha [employer]  Tiene antecedentes familiares de esta afeccin.  Tiene ascendencia afroamericana.  Tuvo su primer menstruacin a una edad temprana (menarquia prematura).  No ha tenido ningn hijo (nuliparidad).  Tiene sobrepeso u obesidad. Cules son los signos o los sntomas? Muchas mujeres no tienen sntomas. Los sntomas de esta afeccin pueden incluir los siguientes:  Hemorragias menstruales abundantes.  Prdidas de [place] o [employer] perodos [place].  Dolor y presin en la zona plvica, entre las caderas.  Problemas en la vejiga, como necesidad imperiosa de orinar u orinar con ms frecuencia que lo habitual.  Incapacidad para tener hijos (infertilidad).  Imposibilidad de llevar un embarazo a trmino (aborto espontneo). Cmo se diagnostica? Esta afeccin se puede diagnosticar en funcin de lo siguiente:  Los sntomas y antecedentes mdicos.  Un examen fsico.  Un examen plvico que incluye la palpacin para detectar tumores.  Estudios de diagnstico por imgenes, como una ecografa o una resonancia magntica (RM). Cmo se trata? El tratamiento de esta afeccin puede incluir lo siguiente:  Ver al [employer] en visitas de seguimiento para controlar si hubo cambios en  los fibromas.  Tomar antiinflamatorios no esteroideos (AINE), como ibuprofeno, naproxeno o aspirina para Best boy.  Medicamentos hormonales. Pueden tomarse en forma de comprimidos, aplicarse mediante inyecciones o administrarse a travs de un dispositivo con forma de T que se coloca en el tero (dispositivo intrauterino o DIU).  Ciruga para extirpar: ? Los fibromas (miomectoma). El mdico puede recomendarle esta opcin si los fibromas afectan su fertilidad y tiene deseos de [place]. ? El tero (histerectoma). ? La irrigacin sangunea a los fibromas (embolizacin de la arteria uterina). Siga estas indicaciones en su casa:  Tome los medicamentos de venta libre y los recetados solamente como se lo haya indicado el mdico.  Consulte a su mdico si debe tomar comprimidos de hierro o comer ms alimentos ricos en hierro, como verduras de hojas de color verde oscuro. El sangrado menstrual excesivo pueden causar niveles bajos de hierro.  Si se lo indican, aplique calor en la espalda o el abdomen para Best boy. Use la fuente de calor que el mdico le recomiende, como una compresa de calor hmedo o una almohadilla trmica. ? Coloque una [employer] piel y la fuente de Freight forwarder. ? Aplique el calor durante 20 a 71minutos. ? Retire la fuente de calor si la piel se pone de color rojo brillante. Esto es muy importante si no puede Education officer, environmental, calor o fro. Puede correr un riesgo mayor de sufrir quemaduras.  Preste mucha atencin a su ciclo menstrual. Informe al mdico si nota algn cambio, por ejemplo: ? Un aumento del flujo de sangre que le exige el uso de ms apsitos o tampones que los que utiliza habitualmente. ? Un cambio en el nmero de [employer] dura la [place]. ?  Un cambio en los sntomas asociados con la [place], como dolor de espalda o clicos abdominales.  Concurra a todas las visitas de seguimiento como se lo haya indicado el mdico. Esto es  importante, especialmente si los fibromas deben controlarse para Actuary cambio. Comunquese con un mdico si:  Tiene dolor plvico, dolor de espalda o clicos abdominales que no se alivian con los medicamentos o al Presenter, broadcasting.  Presenta nuevo sangrado entre los [employer].  Observa un aumento del sangrado durante y [employer] perodos [place].  Se siente inusualmente cansada o dbil.  Tiene sensacin de desvanecimiento. Solicite ayuda inmediatamente si:  Se desmaya.  Tiene un dolor plvico que empeora sbitamente.  Tiene sangrado vaginal intenso que empapa un tampn o un apsito en 30 minutos o menos. Resumen  Los fibromas uterinos son tumores no cancerosos (benignos) que pueden Therapist, art.  Se desconoce la causa exacta de esta afeccin.  La mayora de los fibromas no necesitan tratamiento mdico, a menos que afecten su capacidad para tener hijos (fertilidad).  Comunquese con el mdico si tiene dolor plvico, dolor de espalda o clicos abdominales que no se alivian con medicamentos.  Asegrese de saber por cules sntomas debera obtener ayuda de inmediato. Esta informacin no tiene Marine scientist el consejo del mdico. Asegrese de hacerle al mdico cualquier pregunta que tenga.     Educacin para el paciente: Mtodo de Art therapist para mujeres (Conceptos Bsicos)    Redactado por los mdicos y editores de UpToDate Qu es la planificacin familiar permanente?--Un mtodo de Art therapist es un procedimiento que evita el embarazo permanentemente.  En este artculo se utiliza la palabra "mujeres" para referirse a Electronics engineer y ovarios. Sin embargo, no todas las personas con tero y ovarios se identifican como mujeres. Esta informacin tambin es para los hombres transgnero y las personas no binarias que deseen Environmental health practitioner.  Cules son mis opciones para planificacin  familiar permanente?--El procedimiento ms frecuente se llama "ligadura de trompas". Para realizarlo, un mdico sella los tubos que [place] vulos desde los ovarios [place]. Estos tubos se llaman trompas de [place] (figura 1). Otro procedimiento, llamado "salpingectoma bilateral", consiste en sacar las trompas de [place] completamente. Despus de [place] de trompas o una salpingectoma bilateral, usted no podr International aid/development worker. La planificacin familiar permanente es adecuada para m?--Es una decisin muy personal. Si est considerando Careers information officer, es importante que est segura de que nunca querr quedar embarazada ms adelante. Si cree que es posible que cambie de opinin sobre [place], otro tipo de planificacin familiar podra ser Ardelia Mems mejor opcin. Existen varias opciones que funcionan muy bien para evitar un embarazo pero no son permanentes. Su mdico o enfermero puede darle ms informacin sobre esto. Cmo se realiza el procedimiento?--Los procedimientos de planificacin familiar permanentes implican ciruga.  El tipo de [place] depender de cundo se realice el procedimiento. Puede hacerse en cualquier momento, pero muchas personas que [place] a dar a luz y no desean tener ms hijos eligen realizarse el procedimiento inmediatamente despus del parto. [place] tipos principales son: ?Laparoscopia - Este procedimiento se utiliza si usted no acaba de dar a luz. Le darn medicinas a travs de un tubo delgado que se coloca en una vena, llamado "va intravenosa". Tambin es posible que deba respirar gases a travs de una mscara para quedar inconsciente. Esto se llama "anestesia general". El mdico luego inserta una cmara diminuta e instrumentos a  travs de pequeas incisiones (cortes) en la parte inferior del rea del estmago. A continuacin, [place] anillos o pinzas para cerrar las trompas de [place]. El mdico podra en cambio aplicar calor para sellar las  trompas, o sacarlas totalmente si usted lo desea. [place] personas se recupera de la ciruga despus de 1 a 2 semanas. ?Procedimiento posparto - Es cuando el procedimiento se realiza poco despus de dar a luz. Si tiene un parto vaginal, el procedimiento se realiza despus de tener al beb, mientras todava se est recuperando en el hospital. Primero, se le realizar un "bloqueo espinal". Para un bloqueo espinal, el mdico introduce una pequea aguja en la parte inferior de la espalda hasta el lquido que rodea la mdula espinal. Luego inyecta medicinas que bloquean el dolor y [employer] msculos para que usted no se [place]. Si le aplicaron una "epidural" para el dolor durante el parto, puede usarse en lugar de un bloqueo espinal. Despus, el mdico realiza un pequeo corte debajo del ombligo y saca una pequea parte de cada trompa de [place]. Si tiene una "cesrea" (ciruga para sacar al beb), el mdico puede realizar un procedimiento de planificacin familiar permanente inmediatamente despus de la cesrea, a travs de la misma incisin. Realizarse un procedimiento de planificacin familiar permanente despus de tener un beb no implica que deba quedarse ms [employer] hospital. Esto sucede tanto con un parto vaginal como con Elmon Else. Qu tan eficaz es el procedimiento?--Es muy eficaz. Menos del 1 por ciento de las personas que se Insurance underwriter un procedimiento de Art therapist tiene un Media planner. Cules son las ventajas y desventajas del mtodo de Art therapist?--La principal ventaja es que es permanente y no requiere ningn otro mtodo de planificacin familiar para Environmental health practitioner. La desventaja es que algunas personas [place] de opinin y se arrepienten de haberse realizado el procedimiento porque deciden que s quieren Best boy un Media planner. Es muy poco frecuente tener un embarazo despus de Dispensing optician un procedimiento de Environmental education officer, pero si sucede, existe un riesgo elevado de embarazo "ectpico". Cuando esto sucede, el vulo se une al espermatozoide para formar un embrin, Advertising account executive de aferrarse a la capa que recubre el tero, el embrin se aferra a Engineer, civil (consulting) del cuerpo donde no debera estar y comienza a Designer, industrial/product. Esto puede ser [employer]. Si se realiz un procedimiento de Art therapist y cree que podra estar embarazada, llame a su mdico de inmediato. Qu sucede si cambio de opinin y quiero quedar embarazada?--Es posible someterse a otro procedimiento para tratar de revertir el procedimiento de Art therapist, pero con frecuencia no funciona y puede ser muy costoso.  Las personas tienen ms probabilidades de Quarry manager de opinin si: ?Tenan menos de 30 aos cuando se realizaron el procedimiento de Art therapist ?Tenan problemas de pareja cuando se realizaron el procedimiento ?Empezaron una relacin nueva despus de realizarse el procedimiento ?Tuvieron complicaciones durante el embarazo o un embarazo que no lleg a trmino, o sufrieron la muerte de un beb o un nio La planificacin familiar es una decisin personal que solo usted puede tomar, pero si tiene algn tipo de duda, quizs desee considerar otra forma de planificacin familiar hasta que est segura de su decisin. Hay mtodos que permiten prevenir el embarazo de meses a aos y tambin funcionan [place], [place] no son permanentes. Ms informacin sobre este tema Educacin para el paciente: Electronics engineer del mtodo

## 2020-01-24 ENCOUNTER — Telehealth: Payer: Self-pay

## 2020-01-24 NOTE — Telephone Encounter (Signed)
Rosemarie Ax messaged me that patient has been informed of all this.

## 2020-01-24 NOTE — Telephone Encounter (Signed)
Dr. Delilah Shan sent me a surgery order but stated patient wanted to talk about cost before proceeding. She is Spanish speaking so I forwarded into below to [place] so she can call and relay to patient.  "I spoke with [facility] rep.  Her insurance plan is calendar year plan.  She has a $1500.00 deductible.  She has met zero.  She has a 70/30 co-insurance so she is responsible for the first $1500 in claims and then ins co starts to pay her claims at 70% ands he pays 30% until she spends $2850 out of her pocket. She has met $107.75 of that.  The surgery code shows it is a covered benefit.  GGA estimated allowable for surgery fee (just for Dr. Delilah Shan) is [phone] (towards her deductible) and that will be her surgery prepaymt due before surgery is scheduled. [facility] collects prior to scheduling and I believe that is how it will be done.)  She can call the [facility] at [phone] and check her surgery costs with the [facility].  Her CPT codes are [phone] and [phone].  (She can let them know she will be paying most of her deductible here before she schedules surgery.)"

## 2020-10-11 ENCOUNTER — Ambulatory Visit: Payer: 59 | Admitting: Obstetrics & Gynecology

## 2021-12-25 IMAGING — DX DG CHEST 2V
2 series · 2 of 2 positions shown · non-contrast
Comparison: None.

CLINICAL DATA: Chest pain today.

EXAM:
CHEST - 2 VIEW

[chest lat]
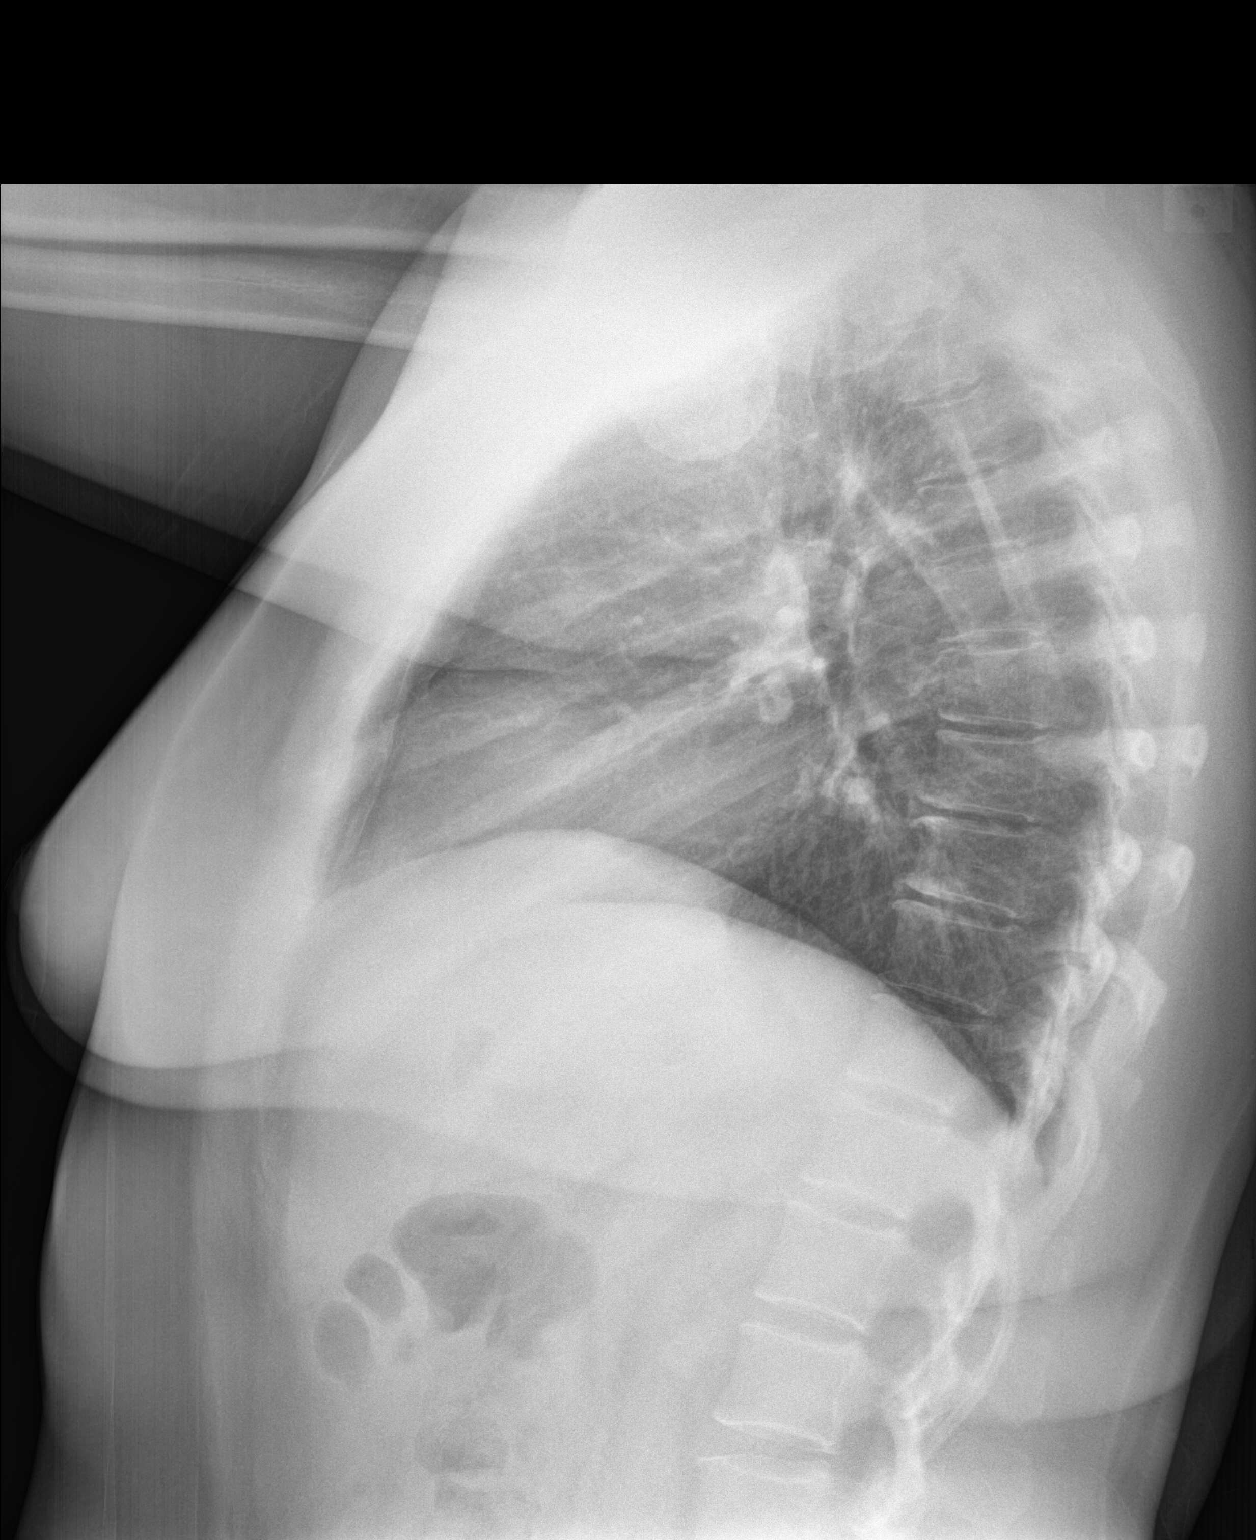

[chest pa]
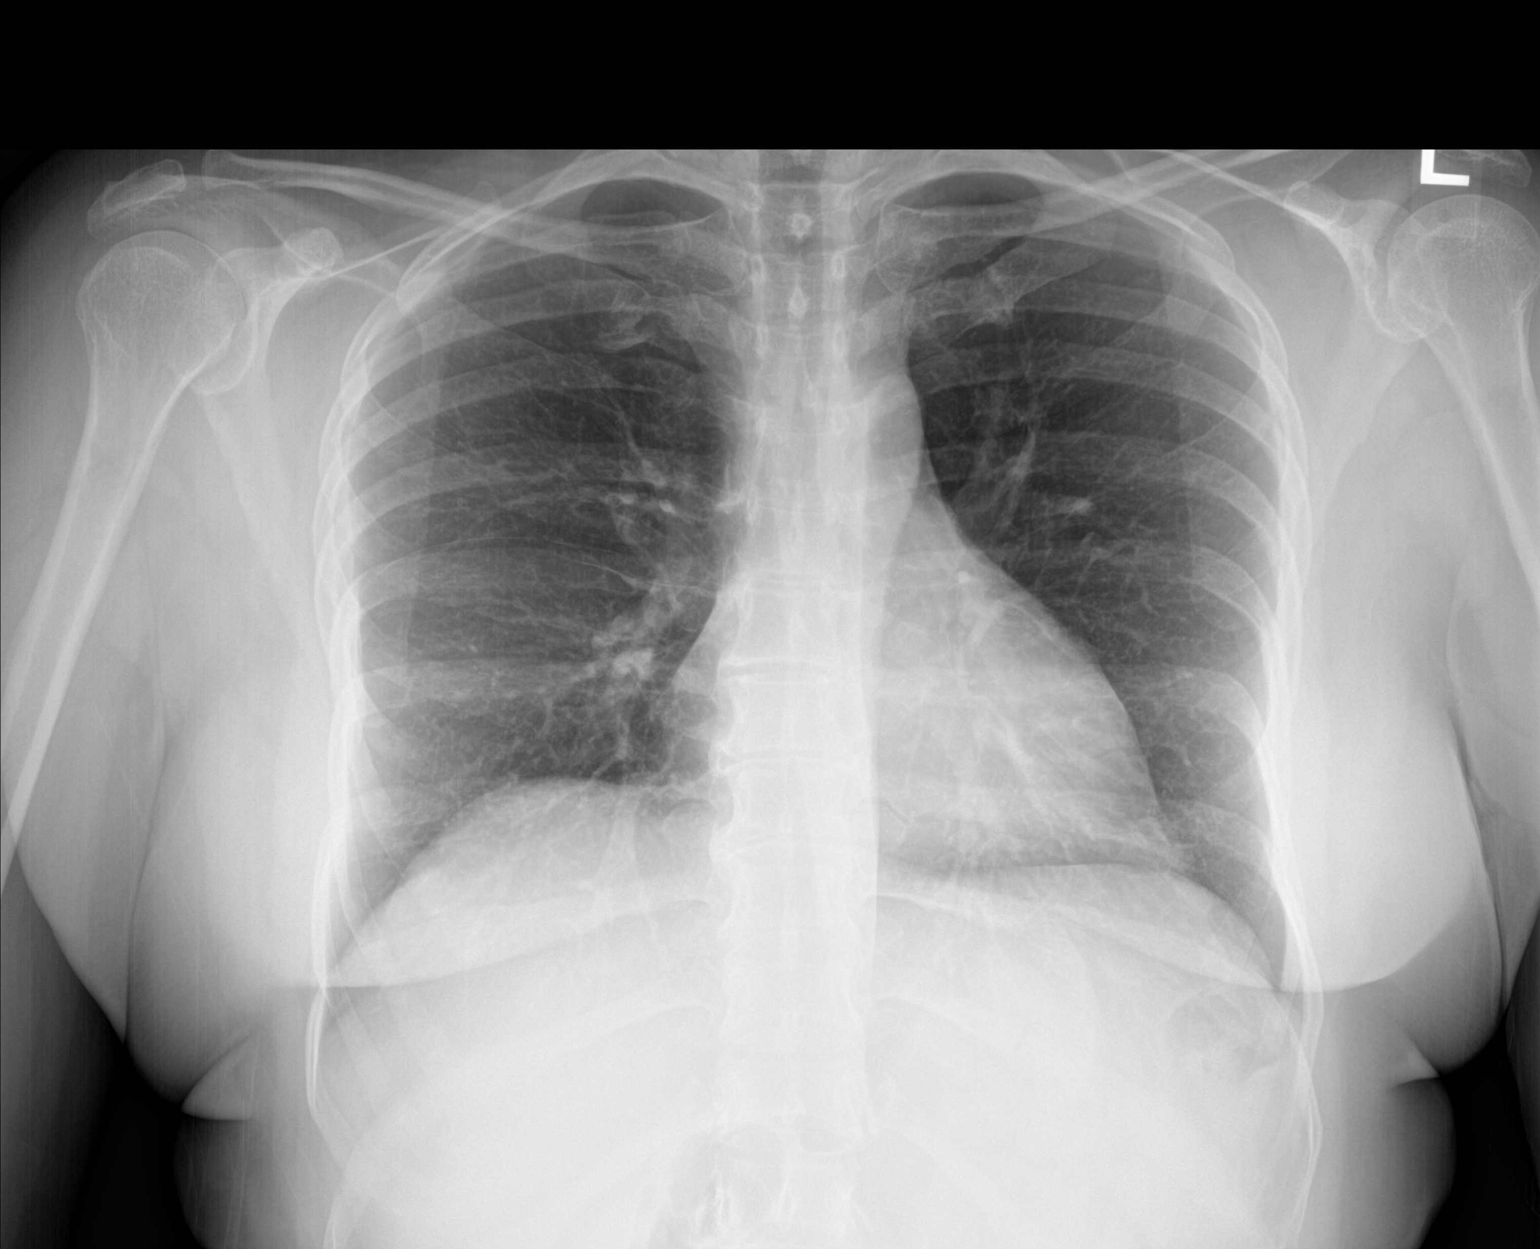

[2 of 2 positions shown; findings below may reference images not displayed]

FINDINGS: Lungs clear. Heart size normal. No pneumothorax or pleural fluid. No
acute or focal bony abnormality.
IMPRESSION: Negative chest.

## 2022-07-18 ENCOUNTER — Emergency Department (HOSPITAL_BASED_OUTPATIENT_CLINIC_OR_DEPARTMENT_OTHER)
Admission: EM | Admit: 2022-07-18 | Discharge: 2022-07-18 | Disposition: A | Discharge status: Home / Self Care | Payer: 59 | Attending: Emergency Medicine | Admitting: Emergency Medicine

## 2022-07-18 ENCOUNTER — Encounter (HOSPITAL_BASED_OUTPATIENT_CLINIC_OR_DEPARTMENT_OTHER): Payer: Self-pay

## 2022-07-18 ENCOUNTER — Emergency Department (HOSPITAL_BASED_OUTPATIENT_CLINIC_OR_DEPARTMENT_OTHER): Payer: 59 | Admitting: Radiology

## 2022-07-18 ENCOUNTER — Other Ambulatory Visit: Payer: Self-pay

## 2022-07-18 DIAGNOSIS — R072 Precordial pain: Secondary | ICD-10-CM | POA: Diagnosis not present

## 2022-07-18 DIAGNOSIS — R079 Chest pain, unspecified: Secondary | ICD-10-CM | POA: Diagnosis present

## 2022-07-18 LAB — CBC
HCT: 37.3 % (ref 36.0–46.0)
Hemoglobin: 12.5 g/dL (ref 12.0–15.0)
MCH: 29.3 pg (ref 26.0–34.0)
MCHC: 33.5 g/dL (ref 30.0–36.0)
MCV: 87.4 fL (ref 80.0–100.0)
Platelets: 260 10*3/uL (ref 150–400)
RBC: 4.27 MIL/uL (ref 3.87–5.11)
RDW: 13.3 % (ref 11.5–15.5)
WBC: 8.8 10*3/uL (ref 4.0–10.5)
nRBC: 0 % (ref 0.0–0.2)

## 2022-07-18 LAB — BASIC METABOLIC PANEL
Anion gap: 9 (ref 5–15)
BUN: 21 mg/dL — ABNORMAL HIGH (ref 6–20)
CO2: 24 mmol/L (ref 22–32)
Calcium: 9.6 mg/dL (ref 8.9–10.3)
Chloride: 103 mmol/L (ref 98–111)
Creatinine, Ser: 0.62 mg/dL (ref 0.44–1.00)
GFR, Estimated: >60 mL/min (ref >60)
Glucose, Bld: 85 mg/dL (ref 70–99)
Potassium: 3.9 mmol/L (ref 3.5–5.1)
Sodium: 136 mmol/L (ref 135–145)

## 2022-07-18 LAB — TROPONIN I (HIGH SENSITIVITY): Troponin I (High Sensitivity): 2 ng/L (ref <18)

## 2022-07-18 LAB — HCG, SERUM, QUALITATIVE: Preg, Serum: NEGATIVE

## 2022-07-18 NOTE — ED Provider Notes (Signed)
[place] EMERGENCY DEPARTMENT AT [facility] Provider Note   CSN: [id number] Arrival date & time: 07/18/22  1656     History  Chief Complaint  Patient presents with   Chest Pain    Meredith Singh is a 52 y.o. female.  Patient with no pertinent past medical history presents today with complaints of chest pain.  She states that same is intermittent in nature.  She states that she has had 3 episodes since yesterday evening where her pain is left-sided and does not radiate.  Pain lasts approximately 2 minutes each time and then resolves without intervention.  It is not worsened with exertion or relieved by rest.  She denies any history of similar symptoms previously.  She denies any cardiac history.  Denies smoking or recreational drug use.  No fevers or chills.  She denies any shortness of breath.  No leg pain or leg swelling.  No cough or congestion.  No diaphoresis, nausea, or vomiting.  Last episode of pain was a few hours ago and she has been asymptomatic since then.  The history is provided by the patient. No language interpreter was used.  Chest Pain      Home Medications Prior to Admission medications   Not on File      Allergies    Patient has no known allergies.    Review of Systems   Review of Systems  Cardiovascular:  Positive for chest pain.  All other systems reviewed and are negative.   Physical Exam Updated Vital Signs BP (!) 152/61 (BP Location: Right Arm)   Pulse 87   Temp (!) 97.3 F (36.3 C) (Temporal)   Resp 18   Ht 5\' 5"  (1.651 m)   Wt 73.9 kg   SpO2 98%   BMI 27.12 kg/m  Physical Exam Vitals and nursing note reviewed.  Constitutional:      General: She is not in acute distress.    Appearance: Normal appearance. She is normal weight. She is not ill-appearing, toxic-appearing or diaphoretic.  HENT:     Head: Normocephalic and atraumatic.  Neck:     Vascular: No JVD.  Cardiovascular:     Rate and Rhythm: Normal rate and regular  rhythm.     Pulses:          Dorsalis pedis pulses are 2+ on the right side and 2+ on the left side.       Posterior tibial pulses are 2+ on the right side and 2+ on the left side.     Heart sounds: Normal heart sounds.  Pulmonary:     Effort: Pulmonary effort is normal. No respiratory distress.     Breath sounds: Normal breath sounds.  Chest:     Chest wall: No tenderness.  Abdominal:     Palpations: Abdomen is soft.     Tenderness: There is no abdominal tenderness.  Musculoskeletal:        General: Normal range of motion.     Cervical back: Normal range of motion and neck supple.     Right lower leg: No tenderness. No edema.     Left lower leg: No tenderness. No edema.  Skin:    General: Skin is warm and dry.  Neurological:     General: No focal deficit present.     Mental Status: She is alert.  Psychiatric:        Mood and Affect: Mood normal.        Behavior: Behavior normal.  ED Results / Procedures / Treatments   Labs (all labs ordered are listed, but only abnormal results are displayed) Labs Reviewed  BASIC METABOLIC PANEL - Abnormal; Notable for the following components:      Result Value   BUN 21 (*)    All other components within normal limits  CBC  HCG, SERUM, QUALITATIVE  TROPONIN I (HIGH SENSITIVITY)    EKG EKG Interpretation  Date/Time:  Thursday Jul 18 2022 17:08:06 EDT Ventricular Rate:  87 PR Interval:  140 QRS Duration: 78 QT Interval:  366 QTC Calculation: 440 R Axis:   83 Text Interpretation: Normal sinus rhythm Cannot rule out Anterior infarct , age undetermined Abnormal ECG No old tracing to compare Confirmed by Melene Plan [phone]) on 07/18/2022 5:10:56 PM  Radiology DG Chest 2 View  Result Date: 07/18/2022 CLINICAL DATA:  CP Endorses Left Sided CP that began last PM and Subsided. Began again Intermittently today. Mild discomfort at this time. No Discernable SOB. EXAM: CHEST - 2 VIEW COMPARISON:  Chest x-ray 08/30/2019 FINDINGS: The  heart and mediastinal contours are within normal limits. Low lung volumes. No focal consolidation. No pulmonary edema. No pleural effusion. No pneumothorax. No acute osseous abnormality. IMPRESSION: Low lung volumes with no active cardiopulmonary disease. Electronically Signed   By: Tish Frederickson M.D.   On: 07/18/2022 17:56    Procedures Procedures    Medications Ordered in ED Medications - No data to display  ED Course/ Medical Decision Making/ A&P             HEART Score: 1                Medical Decision Making Amount and/or Complexity of Data Reviewed Labs: ordered. Radiology: ordered.   This patient is a 52 y.o. female who presents to the ED for concern of chest pain, this involves an extensive number of treatment options, and is a complaint that carries with it a high risk of complications and morbidity. The emergent differential diagnosis prior to evaluation includes, but is not limited to,  ACS, pericarditis, myocarditis, aortic dissection, PE, pneumothorax, esophageal spasm or rupture, chronic angina, pneumonia, bronchitis, GERD, reflux/PUD, biliary disease, pancreatitis, costochondritis, anxiety This is not an exhaustive differential.   Past Medical History / Co-morbidities / Social History: N/A  Physical Exam: Physical exam performed. The pertinent findings include: Per above, no pertinent physical exam findings.  Lab Tests: I ordered, and personally interpreted labs.  The pertinent results include: Troponin negative, no pertinent laboratory findings   Imaging Studies: I ordered imaging studies including CXR. I independently visualized and interpreted imaging which showed NAD. I agree with the radiologist interpretation.   Cardiac Monitoring:  The patient was maintained on a cardiac monitor.  My attending physician Dr. Adela Lank viewed and interpreted the cardiac monitored which showed an underlying rhythm of: no STEMI. I agree with this interpretation.    Disposition: After consideration of the diagnostic results and the patients response to treatment, I feel that emergency department workup does not suggest an emergent condition requiring admission or immediate intervention beyond what has been performed at this time. The plan is: discharge with close pcp follow-up.   After evaluating all of the data points in this case, the presentation of Matsue Picco is NOT consistent with Acute Coronary Syndrome (ACS) and/or myocardial ischemia, pulmonary embolism, aortic dissection; Borhaave's, significant arrythmia, pneumothorax, cardiac tamponade, or other emergent cardiopulmonary condition.  Further, the presentation of Jaziyah Dekam is NOT consistent with pericarditis, myocarditis, mediastinitis, endocarditis,  new valvular disease.  Additionally, the presentation of [facility] NOT consistent with flail chest, cardiac contusion, ARDS, or significant intra-thoracic bleeding.  Moreover, this presentation is NOT consistent with pneumonia or sepsis  The patient has a HEART Score: 1  Patient is currently asymptomatic and without cardiac risk factors and with benign labs.  Given this, no indication for additional workup at this time.  Evaluation and diagnostic testing in the emergency department does not suggest an emergent condition requiring admission or immediate intervention beyond what has been performed at this time.  Plan for discharge with close PCP follow-up.  Patient is understanding and amenable with plan, educated on red flag symptoms that would prompt immediate return.  Patient discharged in stable condition.   Final Clinical Impression(s) / ED Diagnoses Final diagnoses:  Precordial chest pain    Rx / DC Orders ED Discharge Orders     None     An After Visit Summary was printed and given to the patient.     Silva Bandy, PA-C 07/18/22 1929    Melene Plan, DO 07/18/22 2046

## 2022-07-18 NOTE — Discharge Instructions (Signed)
You were seen in the emergency department today for chest pain.  As we discussed your lab work, EKG, chest x-ray all looked reassuring today.   I recommend calling your primary care doctor in the next few days to schedule an appointment for close follow-up.  I recommend monitoring your stress levels.  Continue to monitor how you are doing overall, and return to the emergency department for any new or worsening symptoms such as: Worsening pain or pain with exertion, difficulty breathing, sweating, or pain or swelling in your legs.

## 2022-07-18 NOTE — ED Notes (Addendum)
RN provided AVS using Teachback Method. Patient verbalizes understanding of Discharge Instructions. Opportunity for Questioning and Answers were provided by RN. Patient Discharged from ED ambulatory to home byself.

## 2022-07-18 NOTE — ED Triage Notes (Signed)
Patient here POV from Home.  Endorses Left Sided CP that began last PM and Subsided. Began again Intermittently today. Mild discomfort at this time. No Discernable SOB.  NAD Noted during triage. A&Ox4. GCS 15. Ambulatory.
